# Patient Record
Sex: Female | Born: 1965
Health system: Southern US, Community
[De-identification: ages and names within clinical notes are randomized; demographics above are authoritative.]

## PROBLEM LIST (undated history)

## (undated) DIAGNOSIS — I10 Essential (primary) hypertension: Secondary | ICD-10-CM

## (undated) DIAGNOSIS — J45909 Unspecified asthma, uncomplicated: Secondary | ICD-10-CM

## (undated) DIAGNOSIS — E039 Hypothyroidism, unspecified: Secondary | ICD-10-CM

## (undated) DIAGNOSIS — E0590 Thyrotoxicosis, unspecified without thyrotoxic crisis or storm: Secondary | ICD-10-CM

## (undated) DIAGNOSIS — R112 Nausea with vomiting, unspecified: Secondary | ICD-10-CM

## (undated) DIAGNOSIS — Z9889 Other specified postprocedural states: Secondary | ICD-10-CM

## (undated) HISTORY — PX: COLONOSCOPY: SHX5424

## (undated) HISTORY — PX: ABDOMINAL HYSTERECTOMY: SHX81

---

## 1999-01-14 ENCOUNTER — Other Ambulatory Visit: Admission: RE | Admit: 1999-01-14 | Discharge: 1999-01-14 | Payer: Self-pay | Admitting: Obstetrics & Gynecology

## 2000-12-01 ENCOUNTER — Other Ambulatory Visit: Admission: RE | Admit: 2000-12-01 | Discharge: 2000-12-01 | Payer: Self-pay | Admitting: Obstetrics & Gynecology

## 2002-07-19 ENCOUNTER — Other Ambulatory Visit: Admission: RE | Admit: 2002-07-19 | Discharge: 2002-07-19 | Payer: Self-pay | Admitting: Family Medicine

## 2003-10-31 ENCOUNTER — Other Ambulatory Visit: Admission: RE | Admit: 2003-10-31 | Discharge: 2003-10-31 | Payer: Self-pay | Admitting: Family Medicine

## 2005-11-19 ENCOUNTER — Other Ambulatory Visit: Admission: RE | Admit: 2005-11-19 | Discharge: 2005-11-19 | Payer: Self-pay | Admitting: Family Medicine

## 2009-08-16 ENCOUNTER — Encounter: Admission: RE | Admit: 2009-08-16 | Discharge: 2009-08-16 | Payer: Self-pay | Admitting: Family Medicine

## 2010-02-28 ENCOUNTER — Encounter (INDEPENDENT_AMBULATORY_CARE_PROVIDER_SITE_OTHER): Payer: Self-pay | Admitting: Obstetrics & Gynecology

## 2010-02-28 ENCOUNTER — Inpatient Hospital Stay (HOSPITAL_COMMUNITY): Admission: RE | Admit: 2010-02-28 | Discharge: 2010-03-01 | Payer: Self-pay | Admitting: Obstetrics & Gynecology

## 2011-03-17 LAB — CROSSMATCH
ABO/RH(D): B POS
Antibody Screen: NEGATIVE

## 2011-03-17 LAB — CBC
MCHC: 33.1 g/dL (ref 30.0–36.0)
Platelets: 166 10*3/uL (ref 150–400)
RBC: 4.28 MIL/uL (ref 3.87–5.11)
RDW: 12.9 % (ref 11.5–15.5)
WBC: 10.9 10*3/uL — ABNORMAL HIGH (ref 4.0–10.5)

## 2011-03-17 LAB — PREGNANCY, URINE: Preg Test, Ur: NEGATIVE

## 2011-07-10 ENCOUNTER — Encounter: Payer: Self-pay | Admitting: Podiatry

## 2012-03-22 ENCOUNTER — Other Ambulatory Visit: Payer: Self-pay | Admitting: Family Medicine

## 2012-03-22 DIAGNOSIS — E049 Nontoxic goiter, unspecified: Secondary | ICD-10-CM

## 2012-03-24 ENCOUNTER — Ambulatory Visit
Admission: RE | Admit: 2012-03-24 | Discharge: 2012-03-24 | Disposition: A | Discharge status: Home / Self Care | Payer: 59 | Source: Ambulatory Visit | Attending: Family Medicine | Admitting: Family Medicine

## 2012-03-24 ENCOUNTER — Other Ambulatory Visit: Payer: Self-pay | Admitting: Family Medicine

## 2012-03-24 DIAGNOSIS — E049 Nontoxic goiter, unspecified: Secondary | ICD-10-CM

## 2012-03-24 DIAGNOSIS — E042 Nontoxic multinodular goiter: Secondary | ICD-10-CM

## 2012-03-30 ENCOUNTER — Ambulatory Visit
Admission: RE | Admit: 2012-03-30 | Discharge: 2012-03-30 | Disposition: A | Discharge status: Home / Self Care | Payer: 59 | Source: Ambulatory Visit | Attending: Family Medicine | Admitting: Family Medicine

## 2012-03-30 ENCOUNTER — Other Ambulatory Visit (HOSPITAL_COMMUNITY)
Admission: RE | Admit: 2012-03-30 | Discharge: 2012-03-30 | Disposition: A | Discharge status: Home / Self Care | Payer: 59 | Source: Ambulatory Visit | Attending: Interventional Radiology | Admitting: Interventional Radiology

## 2012-03-30 DIAGNOSIS — E042 Nontoxic multinodular goiter: Secondary | ICD-10-CM

## 2012-03-30 DIAGNOSIS — E049 Nontoxic goiter, unspecified: Secondary | ICD-10-CM | POA: Insufficient documentation

## 2012-03-31 ENCOUNTER — Inpatient Hospital Stay
Admission: RE | Admit: 2012-03-31 | Discharge: 2012-03-31 | Payer: 59 | Source: Ambulatory Visit | Attending: Family Medicine | Admitting: Family Medicine

## 2012-03-31 ENCOUNTER — Other Ambulatory Visit: Payer: 59

## 2013-03-22 ENCOUNTER — Other Ambulatory Visit: Payer: Self-pay | Admitting: Family Medicine

## 2013-03-22 DIAGNOSIS — E0590 Thyrotoxicosis, unspecified without thyrotoxic crisis or storm: Secondary | ICD-10-CM

## 2013-04-05 ENCOUNTER — Ambulatory Visit
Admission: RE | Admit: 2013-04-05 | Discharge: 2013-04-05 | Disposition: A | Discharge status: Home / Self Care | Payer: 59 | Source: Ambulatory Visit | Attending: Family Medicine | Admitting: Family Medicine

## 2013-04-05 DIAGNOSIS — E0590 Thyrotoxicosis, unspecified without thyrotoxic crisis or storm: Secondary | ICD-10-CM

## 2014-03-27 ENCOUNTER — Other Ambulatory Visit: Payer: Self-pay | Admitting: Family Medicine

## 2014-03-27 DIAGNOSIS — E041 Nontoxic single thyroid nodule: Secondary | ICD-10-CM

## 2014-03-30 ENCOUNTER — Ambulatory Visit
Admission: RE | Admit: 2014-03-30 | Discharge: 2014-03-30 | Disposition: A | Discharge status: Home / Self Care | Payer: 59 | Source: Ambulatory Visit | Attending: Family Medicine | Admitting: Family Medicine

## 2014-03-30 DIAGNOSIS — E041 Nontoxic single thyroid nodule: Secondary | ICD-10-CM

## 2015-03-30 ENCOUNTER — Other Ambulatory Visit: Payer: Self-pay | Admitting: Family Medicine

## 2015-03-30 DIAGNOSIS — E0590 Thyrotoxicosis, unspecified without thyrotoxic crisis or storm: Secondary | ICD-10-CM

## 2015-04-09 ENCOUNTER — Other Ambulatory Visit: Payer: Self-pay

## 2015-04-17 ENCOUNTER — Ambulatory Visit
Admission: RE | Admit: 2015-04-17 | Discharge: 2015-04-17 | Disposition: A | Discharge status: Home / Self Care | Payer: 59 | Source: Ambulatory Visit | Attending: Family Medicine | Admitting: Family Medicine

## 2015-04-17 DIAGNOSIS — E0590 Thyrotoxicosis, unspecified without thyrotoxic crisis or storm: Secondary | ICD-10-CM

## 2015-09-12 ENCOUNTER — Other Ambulatory Visit: Payer: Self-pay | Admitting: Otolaryngology

## 2015-09-12 DIAGNOSIS — H905 Unspecified sensorineural hearing loss: Secondary | ICD-10-CM

## 2015-09-12 DIAGNOSIS — Z77018 Contact with and (suspected) exposure to other hazardous metals: Secondary | ICD-10-CM

## 2015-09-12 DIAGNOSIS — H903 Sensorineural hearing loss, bilateral: Secondary | ICD-10-CM

## 2015-09-12 DIAGNOSIS — H9312 Tinnitus, left ear: Secondary | ICD-10-CM

## 2015-09-13 ENCOUNTER — Ambulatory Visit
Admission: RE | Admit: 2015-09-13 | Discharge: 2015-09-13 | Disposition: A | Discharge status: Home / Self Care | Payer: 59 | Source: Ambulatory Visit | Attending: Otolaryngology | Admitting: Otolaryngology

## 2015-09-13 DIAGNOSIS — Z77018 Contact with and (suspected) exposure to other hazardous metals: Secondary | ICD-10-CM

## 2015-09-13 DIAGNOSIS — H905 Unspecified sensorineural hearing loss: Secondary | ICD-10-CM

## 2015-09-13 DIAGNOSIS — H9312 Tinnitus, left ear: Secondary | ICD-10-CM

## 2015-09-13 DIAGNOSIS — H903 Sensorineural hearing loss, bilateral: Secondary | ICD-10-CM

## 2015-09-13 MED ORDER — GADOBENATE DIMEGLUMINE 529 MG/ML IV SOLN
15.0000 mL | Freq: Once | INTRAVENOUS | Status: AC | PRN
  Administered 2015-09-13: 15 mL via INTRAVENOUS

## 2016-09-09 ENCOUNTER — Other Ambulatory Visit: Payer: Self-pay | Admitting: Family Medicine

## 2016-09-09 DIAGNOSIS — E0590 Thyrotoxicosis, unspecified without thyrotoxic crisis or storm: Secondary | ICD-10-CM

## 2016-09-30 ENCOUNTER — Ambulatory Visit
Admission: RE | Admit: 2016-09-30 | Discharge: 2016-09-30 | Disposition: A | Discharge status: Home / Self Care | Payer: BLUE CROSS/BLUE SHIELD | Source: Ambulatory Visit | Attending: Family Medicine | Admitting: Family Medicine

## 2016-09-30 DIAGNOSIS — E0590 Thyrotoxicosis, unspecified without thyrotoxic crisis or storm: Secondary | ICD-10-CM

## 2017-09-22 ENCOUNTER — Other Ambulatory Visit: Payer: Self-pay | Admitting: Family Medicine

## 2017-09-22 DIAGNOSIS — E0590 Thyrotoxicosis, unspecified without thyrotoxic crisis or storm: Secondary | ICD-10-CM

## 2017-09-30 ENCOUNTER — Ambulatory Visit
Admission: RE | Admit: 2017-09-30 | Discharge: 2017-09-30 | Disposition: A | Discharge status: Home / Self Care | Payer: BLUE CROSS/BLUE SHIELD | Source: Ambulatory Visit | Attending: Family Medicine | Admitting: Family Medicine

## 2017-09-30 DIAGNOSIS — E0590 Thyrotoxicosis, unspecified without thyrotoxic crisis or storm: Secondary | ICD-10-CM

## 2018-02-26 ENCOUNTER — Other Ambulatory Visit (HOSPITAL_COMMUNITY): Payer: Self-pay | Admitting: Endocrinology

## 2018-02-26 DIAGNOSIS — E0590 Thyrotoxicosis, unspecified without thyrotoxic crisis or storm: Secondary | ICD-10-CM

## 2018-03-03 ENCOUNTER — Encounter (HOSPITAL_COMMUNITY): Payer: BLUE CROSS/BLUE SHIELD

## 2018-03-04 ENCOUNTER — Other Ambulatory Visit (HOSPITAL_COMMUNITY): Payer: BLUE CROSS/BLUE SHIELD

## 2018-03-11 ENCOUNTER — Encounter (HOSPITAL_COMMUNITY): Payer: Self-pay

## 2018-03-11 ENCOUNTER — Encounter (HOSPITAL_COMMUNITY)
Admission: RE | Admit: 2018-03-11 | Discharge: 2018-03-11 | Disposition: A | Discharge status: Home / Self Care | Payer: BLUE CROSS/BLUE SHIELD | Source: Ambulatory Visit | Attending: Endocrinology | Admitting: Endocrinology

## 2018-03-11 DIAGNOSIS — E0590 Thyrotoxicosis, unspecified without thyrotoxic crisis or storm: Secondary | ICD-10-CM | POA: Diagnosis not present

## 2018-03-11 MED ORDER — SODIUM IODIDE I 131 CAPSULE
14.6000 | Freq: Once | INTRAVENOUS | Status: AC | PRN
  Administered 2018-03-11: 14.6 via ORAL

## 2018-03-12 ENCOUNTER — Encounter (HOSPITAL_COMMUNITY)
Admission: RE | Admit: 2018-03-12 | Discharge: 2018-03-12 | Disposition: A | Discharge status: Home / Self Care | Payer: BLUE CROSS/BLUE SHIELD | Source: Ambulatory Visit | Attending: Endocrinology | Admitting: Endocrinology

## 2018-03-12 MED ORDER — SODIUM PERTECHNETATE TC 99M INJECTION
10.8000 | Freq: Once | INTRAVENOUS | Status: AC | PRN
  Administered 2018-03-12: 10.8 via INTRAVENOUS

## 2018-05-19 NOTE — H&P (Signed)
  HPI:   Kelsey Jefferson is a 52 y.o. female who presents as a consult Patient.   Referring Provider: Soyla Dryer*  Chief complaint: Hyperthyroid.  HPI: Hyperthyroidism, followed for her age. She has been subclinical the entire time. She recently went to endocrinology for the first time and options were discussed including medical treatment, radioactive iodine ablation, surgical thyroidectomy. She is interested in surgical thyroidectomy. She is here to discuss this.  PMH/Meds/All/SocHx/FamHx/ROS:   Past Medical History:  Diagnosis Date  . Hypertension  . Thyroid condition   Past Surgical History:  Procedure Laterality Date  . HYSTERECTOMY   No family history of bleeding disorders, wound healing problems or difficulty with anesthesia.   Social History   Social History  . Marital status: Married  Spouse name: N/A  . Number of children: N/A  . Years of education: N/A   Occupational History  . Not on file.   Social History Main Topics  . Smoking status: Never Smoker  . Smokeless tobacco: Current User  . Alcohol use Not on file  . Drug use: Unknown  . Sexual activity: Not on file   Other Topics Concern  . Not on file   Social History Narrative  . No narrative on file   Current Outpatient Prescriptions:  . hydroCHLOROthiazide (HYDRODIURIL) 25 MG tablet, TAKE 1 TABLET BY MOUTH EVERY DAY IN THE MORNING, Disp: , Rfl: 3  A complete ROS was performed with pertinent positives/negatives noted in the HPI. The remainder of the ROS are negative.   Physical Exam:   Ht 1.676 m ( )  Wt 76.2 kg (168 lb)  BMI 27.12 kg/m   General: Healthy and alert, in no distress, breathing easily. Normal affect. In a pleasant mood. Head: Normocephalic, atraumatic. No masses, or scars. Eyes: Pupils are equal, and reactive to light. Vision is grossly intact. No spontaneous or gaze nystagmus. Ears: Bilateral cerumen impaction cleaned out under the microscope. Tympanic membranes  are intact, with normal landmarks and the middle ears are clear and healthy. Hearing: Grossly normal. Nose: Nasal cavities are clear with healthy mucosa, no polyps or exudate. Airways are patent. Face: No masses or scars, facial nerve function is symmetric. Oral Cavity: No mucosal abnormalities are noted. Tongue with normal mobility. Dentition appears healthy. Oropharynx: Tonsils are symmetric. There are no mucosal masses identified. Tongue base appears normal and healthy. Larynx/Hypopharynx: indirect exam reveals healthy, mobile vocal cords, without mucosal lesions in the hypopharynx or larynx. Chest: Deferred Neck: No palpable masses, no cervical adenopathy, nondescript enlargement of the left lobe of the thyroid. Neuro: Cranial nerves II-XII with normal function. Balance: Normal gate. Other findings: none.  Independent Review of Additional Tests or Records:  none  Procedures:  Procedure note:  Indications: Cerumen impaction  Details of cerumen removal were discussed with the patient and all questions were answered.  Procedure:  Using the operating microscope, both sides were cleaned of cerumen using curettes. There was no signs of infection. Symptoms were releaved.  She tolerated this procedure well. There were no complications.  Impression & Plans:  Bilateral cerumen impaction, cleaned out under the microscope today. Ears otherwise healthy.  Subclinical hyperthyroidism. We discussed the risks and benefits of thyroidectomy surgery. All questions were answered and a handout was provided. She would like to think about this and will call when she is ready to schedule.

## 2018-06-10 NOTE — Pre-Procedure Instructions (Signed)
Ricarda FrameYvonne D Riebel  06/10/2018      [id number] [place] - [id number] [place] [id number] Phone: [phone] Fax: [phone]    Your procedure is scheduled on June 28  [facility]30 A.M.  Call this number if you have problems the morning of surgery:  [phone]   Remember:  NOTHING TO EAT OR DRINK AFTER MIDNIGHT    Take these medicines the morning of surgery with A SIP OF WATER  loratadine (CLARITIN)  7 days prior to surgery STOP taking any Aspirin(unless otherwise instructed by your surgeon), Aleve, Naproxen, Ibuprofen, Motrin, Advil, Goody's, BC's, all herbal medications, fish oil, and all vitamins     Do not wear jewelry, make-up or nail polish.  Do not wear lotions, powders, or perfumes, or deodorant.  Do not shave 48 hours prior to surgery.   Do not bring valuables to the hospital.  [facility] is not responsible for any belongings or valuables.  Contacts, dentures or bridgework may not be worn into surgery.  Leave your suitcase in the car.  After surgery it may be brought to your room.  For patients admitted to the hospital, discharge time will be determined by your treatment team.  Patients discharged the day of surgery will not be allowed to drive home.    Special instructions:   [facility]- Preparing For Surgery  Before surgery, you can play an important role. Because skin is not sterile, your skin needs to be as free of germs as possible. You can reduce the number of germs on your skin by washing with CHG (chlorahexidine gluconate) Soap before surgery.  CHG is an antiseptic cleaner which kills germs and bonds with the skin to continue killing germs even after washing.    Oral Hygiene is also important to reduce your risk of infection.  Remember - BRUSH YOUR TEETH THE MORNING OF SURGERY WITH YOUR REGULAR TOOTHPASTE  Please do not use  if you have an allergy to CHG or antibacterial soaps. If your skin becomes reddened/irritated stop using the CHG.  Do not shave (including legs and underarms) for at least 48 hours prior to first CHG shower. It is OK to shave your face.  Please follow these instructions carefully.   1. Shower the NIGHT BEFORE SURGERY and the MORNING OF SURGERY with CHG.   2. If you chose to wash your hair, wash your hair first as usual with your normal shampoo.  3. After you shampoo, rinse your hair and body thoroughly to remove the shampoo.  4. Use CHG as you would any other liquid soap. You can apply CHG directly to the skin and wash gently with a scrungie or a clean washcloth.   5. Apply the CHG Soap to your body ONLY FROM THE NECK DOWN.  Do not use on open wounds or open sores. Avoid contact with your eyes, ears, mouth and genitals (private parts). Wash Face and genitals (private parts)  with your normal soap.  6. Wash thoroughly, paying special attention to the area where your surgery will be performed.  7. Thoroughly rinse your body with warm water from the neck down.  8. DO NOT shower/wash with your normal soap after using and rinsing off the CHG Soap.  9. Pat yourself dry with a CLEAN TOWEL.  10. Wear CLEAN PAJAMAS to bed the night before surgery, wear comfortable clothes the morning  of surgery  11. Place CLEAN SHEETS on your bed the night of your first shower and DO NOT SLEEP WITH PETS.    Day of Surgery:  Do not apply any deodorants/lotions.  Please wear clean clothes to the hospital/surgery center.   Remember to brush your teeth WITH YOUR REGULAR TOOTHPASTE.   Please read over the following fact sheets that you were given.

## 2018-06-11 ENCOUNTER — Encounter (HOSPITAL_COMMUNITY)
Admission: RE | Admit: 2018-06-11 | Discharge: 2018-06-11 | Disposition: A | Discharge status: Home / Self Care | Payer: BLUE CROSS/BLUE SHIELD | Source: Ambulatory Visit | Attending: Otolaryngology | Admitting: Otolaryngology

## 2018-06-11 ENCOUNTER — Other Ambulatory Visit: Payer: Self-pay

## 2018-06-11 ENCOUNTER — Ambulatory Visit (HOSPITAL_COMMUNITY)
Admission: RE | Admit: 2018-06-11 | Discharge: 2018-06-11 | Disposition: A | Discharge status: Home / Self Care | Payer: BLUE CROSS/BLUE SHIELD | Source: Ambulatory Visit | Attending: Anesthesiology | Admitting: Anesthesiology

## 2018-06-11 ENCOUNTER — Other Ambulatory Visit (HOSPITAL_COMMUNITY): Payer: BLUE CROSS/BLUE SHIELD

## 2018-06-11 ENCOUNTER — Encounter (HOSPITAL_COMMUNITY): Payer: Self-pay

## 2018-06-11 DIAGNOSIS — E0590 Thyrotoxicosis, unspecified without thyrotoxic crisis or storm: Secondary | ICD-10-CM | POA: Insufficient documentation

## 2018-06-11 DIAGNOSIS — Z01818 Encounter for other preprocedural examination: Secondary | ICD-10-CM

## 2018-06-11 DIAGNOSIS — I10 Essential (primary) hypertension: Secondary | ICD-10-CM | POA: Diagnosis not present

## 2018-06-11 DIAGNOSIS — I498 Other specified cardiac arrhythmias: Secondary | ICD-10-CM | POA: Diagnosis not present

## 2018-06-11 HISTORY — DX: Nausea with vomiting, unspecified: Z98.890

## 2018-06-11 HISTORY — DX: Essential (primary) hypertension: I10

## 2018-06-11 HISTORY — DX: Nausea with vomiting, unspecified: R11.2

## 2018-06-11 LAB — CBC
HCT: 42.5 % (ref 36.0–46.0)
Hemoglobin: 14.2 g/dL (ref 12.0–15.0)
MCH: 31.1 pg (ref 26.0–34.0)
MCHC: 33.4 g/dL (ref 30.0–36.0)
MCV: 93.2 fL (ref 78.0–100.0)
PLATELETS: 190 10*3/uL (ref 150–400)
RBC: 4.56 MIL/uL (ref 3.87–5.11)
RDW: 12 % (ref 11.5–15.5)
WBC: 5.3 10*3/uL (ref 4.0–10.5)

## 2018-06-11 LAB — BASIC METABOLIC PANEL
Anion gap: 8 (ref 5–15)
BUN: 13 mg/dL (ref 6–20)
CALCIUM: 9.2 mg/dL (ref 8.9–10.3)
CO2: 26 mmol/L (ref 22–32)
Chloride: 106 mmol/L (ref 101–111)
Creatinine, Ser: 0.91 mg/dL (ref 0.44–1.00)
GFR calc Af Amer: >60 mL/min (ref >60)
Glucose, Bld: 93 mg/dL (ref 65–99)
POTASSIUM: 3.9 mmol/L (ref 3.5–5.1)
SODIUM: 140 mmol/L (ref 135–145)

## 2018-06-11 NOTE — Progress Notes (Signed)
PCP - Sigmund HazelLisa Miller  Cardiologist - denies  Chest x-ray - 06/11/18 EKG - 06/11/18 Stress Test - denies ECHO - denies Cardiac Cath - denies   Anesthesia review: NO  Patient denies shortness of breath, fever, cough and chest pain at PAT appointment   Patient verbalized understanding of instructions that were given to them at the PAT appointment. Patient was also instructed that they will need to review over the PAT instructions again at home before surgery.

## 2018-06-18 ENCOUNTER — Other Ambulatory Visit: Payer: Self-pay

## 2018-06-18 ENCOUNTER — Ambulatory Visit (HOSPITAL_COMMUNITY): Payer: BLUE CROSS/BLUE SHIELD | Admitting: Anesthesiology

## 2018-06-18 ENCOUNTER — Encounter (HOSPITAL_COMMUNITY)
Admission: RE | Disposition: A | Discharge status: Home / Self Care | Payer: Self-pay | Source: Ambulatory Visit | Attending: Otolaryngology

## 2018-06-18 ENCOUNTER — Observation Stay (HOSPITAL_COMMUNITY)
Admission: RE | Admit: 2018-06-18 | Discharge: 2018-06-19 | Disposition: A | Discharge status: Home / Self Care | Payer: BLUE CROSS/BLUE SHIELD | Source: Ambulatory Visit | Attending: Otolaryngology | Admitting: Otolaryngology

## 2018-06-18 ENCOUNTER — Encounter (HOSPITAL_COMMUNITY): Payer: Self-pay | Admitting: *Deleted

## 2018-06-18 DIAGNOSIS — E890 Postprocedural hypothyroidism: Secondary | ICD-10-CM

## 2018-06-18 DIAGNOSIS — Z79899 Other long term (current) drug therapy: Secondary | ICD-10-CM | POA: Insufficient documentation

## 2018-06-18 DIAGNOSIS — Z91040 Latex allergy status: Secondary | ICD-10-CM | POA: Diagnosis not present

## 2018-06-18 DIAGNOSIS — Z90710 Acquired absence of both cervix and uterus: Secondary | ICD-10-CM | POA: Diagnosis not present

## 2018-06-18 DIAGNOSIS — E041 Nontoxic single thyroid nodule: Secondary | ICD-10-CM | POA: Insufficient documentation

## 2018-06-18 DIAGNOSIS — Z91048 Other nonmedicinal substance allergy status: Secondary | ICD-10-CM | POA: Diagnosis not present

## 2018-06-18 DIAGNOSIS — I10 Essential (primary) hypertension: Secondary | ICD-10-CM | POA: Diagnosis not present

## 2018-06-18 DIAGNOSIS — Z91013 Allergy to seafood: Secondary | ICD-10-CM | POA: Insufficient documentation

## 2018-06-18 DIAGNOSIS — E0590 Thyrotoxicosis, unspecified without thyrotoxic crisis or storm: Secondary | ICD-10-CM | POA: Diagnosis not present

## 2018-06-18 HISTORY — PX: TOTAL THYROIDECTOMY: SHX2547

## 2018-06-18 HISTORY — PX: THYROIDECTOMY: SHX17

## 2018-06-18 HISTORY — DX: Unspecified asthma, uncomplicated: J45.909

## 2018-06-18 HISTORY — DX: Thyrotoxicosis, unspecified without thyrotoxic crisis or storm: E05.90

## 2018-06-18 HISTORY — DX: Hypothyroidism, unspecified: E03.9

## 2018-06-18 LAB — CALCIUM
CALCIUM: 8.8 mg/dL — AB (ref 8.9–10.3)
Calcium: 8.9 mg/dL (ref 8.9–10.3)

## 2018-06-18 SURGERY — THYROIDECTOMY
Anesthesia: General | Site: Neck

## 2018-06-18 MED ORDER — FENTANYL CITRATE (PF) 100 MCG/2ML IJ SOLN
INTRAMUSCULAR | Status: DC | PRN
  Administered 2018-06-18: 150 ug via INTRAVENOUS
  Administered 2018-06-18: 50 ug via INTRAVENOUS

## 2018-06-18 MED ORDER — DEXAMETHASONE SODIUM PHOSPHATE 10 MG/ML IJ SOLN
INTRAMUSCULAR | Status: AC
  Filled 2018-06-18: qty 1

## 2018-06-18 MED ORDER — ONDANSETRON HCL 4 MG/2ML IJ SOLN
INTRAMUSCULAR | Status: DC | PRN
  Administered 2018-06-18: 4 mg via INTRAVENOUS

## 2018-06-18 MED ORDER — LIDOCAINE HCL (CARDIAC) PF 100 MG/5ML IV SOSY
PREFILLED_SYRINGE | INTRAVENOUS | Status: DC | PRN
  Administered 2018-06-18: 80 mg via INTRAVENOUS

## 2018-06-18 MED ORDER — PROMETHAZINE HCL 25 MG RE SUPP: 25.0000 mg | Freq: Four times a day (QID) | RECTAL | 1 refills | Status: AC | PRN

## 2018-06-18 MED ORDER — FENTANYL CITRATE (PF) 250 MCG/5ML IJ SOLN
INTRAMUSCULAR | Status: AC
  Filled 2018-06-18: qty 5

## 2018-06-18 MED ORDER — PROMETHAZINE HCL 25 MG RE SUPP
25.0000 mg | Freq: Four times a day (QID) | RECTAL | Status: DC | PRN
  Filled 2018-06-18: qty 1

## 2018-06-18 MED ORDER — LEVOTHYROXINE SODIUM 100 MCG PO TABS: 100.0000 ug | ORAL_TABLET | Freq: Every day | ORAL | 6 refills | Status: AC

## 2018-06-18 MED ORDER — MIDAZOLAM HCL 2 MG/2ML IJ SOLN
INTRAMUSCULAR | Status: AC
  Filled 2018-06-18: qty 2

## 2018-06-18 MED ORDER — HYDROCHLOROTHIAZIDE 25 MG PO TABS
25.0000 mg | ORAL_TABLET | Freq: Every evening | ORAL | Status: DC
  Administered 2018-06-18: 25 mg via ORAL
  Filled 2018-06-18: qty 1

## 2018-06-18 MED ORDER — PROMETHAZINE HCL 25 MG PO TABS: 25.0000 mg | ORAL_TABLET | Freq: Four times a day (QID) | ORAL | Status: DC | PRN

## 2018-06-18 MED ORDER — ROCURONIUM BROMIDE 100 MG/10ML IV SOLN
INTRAVENOUS | Status: DC | PRN
  Administered 2018-06-18: 50 mg via INTRAVENOUS

## 2018-06-18 MED ORDER — LEVOTHYROXINE SODIUM 100 MCG PO TABS
100.0000 ug | ORAL_TABLET | Freq: Every day | ORAL | Status: DC
  Administered 2018-06-19: 100 ug via ORAL
  Filled 2018-06-18: qty 1

## 2018-06-18 MED ORDER — LORATADINE 10 MG PO TABS: 10.0000 mg | ORAL_TABLET | Freq: Every day | ORAL | Status: DC | PRN

## 2018-06-18 MED ORDER — MEGA MULTIVITAMIN PO POWD: 1.0000 | ORAL | Status: DC

## 2018-06-18 MED ORDER — 0.9 % SODIUM CHLORIDE (POUR BTL) OPTIME
TOPICAL | Status: DC | PRN
  Administered 2018-06-18: 1000 mL

## 2018-06-18 MED ORDER — PROPOFOL 10 MG/ML IV BOLUS
INTRAVENOUS | Status: AC
  Filled 2018-06-18: qty 20

## 2018-06-18 MED ORDER — SUGAMMADEX SODIUM 200 MG/2ML IV SOLN
INTRAVENOUS | Status: DC | PRN
  Administered 2018-06-18: 75 mg via INTRAVENOUS
  Administered 2018-06-18: 100 mg via INTRAVENOUS

## 2018-06-18 MED ORDER — ONDANSETRON HCL 4 MG/2ML IJ SOLN
INTRAMUSCULAR | Status: AC
  Filled 2018-06-18: qty 2

## 2018-06-18 MED ORDER — ADULT MULTIVITAMIN W/MINERALS CH: 1.0000 | ORAL_TABLET | ORAL | Status: DC

## 2018-06-18 MED ORDER — SUGAMMADEX SODIUM 200 MG/2ML IV SOLN
INTRAVENOUS | Status: AC
  Filled 2018-06-18: qty 2

## 2018-06-18 MED ORDER — HYDROCODONE-ACETAMINOPHEN 5-325 MG PO TABS: 1.0000 | ORAL_TABLET | ORAL | Status: DC | PRN

## 2018-06-18 MED ORDER — LIDOCAINE 2% (20 MG/ML) 5 ML SYRINGE
INTRAMUSCULAR | Status: AC
  Filled 2018-06-18: qty 5

## 2018-06-18 MED ORDER — MIDAZOLAM HCL 5 MG/5ML IJ SOLN
INTRAMUSCULAR | Status: DC | PRN
  Administered 2018-06-18: 2 mg via INTRAVENOUS

## 2018-06-18 MED ORDER — DEXTROSE-NACL 5-0.9 % IV SOLN
INTRAVENOUS | Status: DC
  Administered 2018-06-18: 1 mL via INTRAVENOUS

## 2018-06-18 MED ORDER — LIDOCAINE-EPINEPHRINE 1 %-1:100000 IJ SOLN
INTRAMUSCULAR | Status: DC | PRN
  Administered 2018-06-18: 20 mL

## 2018-06-18 MED ORDER — LACTATED RINGERS IV SOLN
INTRAVENOUS | Status: DC
  Administered 2018-06-18 (×2): via INTRAVENOUS

## 2018-06-18 MED ORDER — DEXAMETHASONE SODIUM PHOSPHATE 10 MG/ML IJ SOLN
INTRAMUSCULAR | Status: DC | PRN
  Administered 2018-06-18: 10 mg via INTRAVENOUS

## 2018-06-18 MED ORDER — IBUPROFEN 100 MG/5ML PO SUSP
400.0000 mg | Freq: Four times a day (QID) | ORAL | Status: DC | PRN
  Filled 2018-06-18: qty 20

## 2018-06-18 MED ORDER — VALACYCLOVIR HCL 500 MG PO TABS: 500.0000 mg | ORAL_TABLET | Freq: Two times a day (BID) | ORAL | Status: DC | PRN

## 2018-06-18 MED ORDER — PROPOFOL 10 MG/ML IV BOLUS
INTRAVENOUS | Status: DC | PRN
  Administered 2018-06-18: 140 mg via INTRAVENOUS

## 2018-06-18 MED ORDER — MULTIVITAMIN PO LIQD: 1.0000 | ORAL | Status: DC

## 2018-06-18 MED ORDER — LIDOCAINE-EPINEPHRINE 1 %-1:100000 IJ SOLN
INTRAMUSCULAR | Status: AC
  Filled 2018-06-18: qty 1

## 2018-06-18 MED ORDER — PHENYLEPHRINE HCL 10 MG/ML IJ SOLN
INTRAMUSCULAR | Status: DC | PRN
  Administered 2018-06-18 (×3): 80 ug via INTRAVENOUS

## 2018-06-18 MED ORDER — HYDROCODONE-ACETAMINOPHEN 7.5-325 MG PO TABS: 1.0000 | ORAL_TABLET | Freq: Four times a day (QID) | ORAL | 0 refills | Status: AC | PRN

## 2018-06-18 SURGICAL SUPPLY — 46 items
ADH SKN CLS APL DERMABOND .7 (GAUZE/BANDAGES/DRESSINGS) ×1
BLADE SURG 15 STRL LF DISP TIS (BLADE) IMPLANT
BLADE SURG 15 STRL SS (BLADE)
CANISTER SUCT 3000ML PPV (MISCELLANEOUS) ×2 IMPLANT
CLEANER TIP ELECTROSURG 2X2 (MISCELLANEOUS) ×2 IMPLANT
CONT SPEC 4OZ CLIKSEAL STRL BL (MISCELLANEOUS) ×2 IMPLANT
CORDS BIPOLAR (ELECTRODE) ×2 IMPLANT
COVER SURGICAL LIGHT HANDLE (MISCELLANEOUS) ×2 IMPLANT
DERMABOND ADVANCED (GAUZE/BANDAGES/DRESSINGS) ×1
DERMABOND ADVANCED .7 DNX12 (GAUZE/BANDAGES/DRESSINGS) ×1 IMPLANT
DRAIN HEMOVAC 7FR (DRAIN) IMPLANT
DRAIN SNY 10 ROU (WOUND CARE) ×1 IMPLANT
DRAPE HALF SHEET 40X57 (DRAPES) IMPLANT
ELECT COATED BLADE 2.86 ST (ELECTRODE) ×2 IMPLANT
ELECT REM PT RETURN 9FT ADLT (ELECTROSURGICAL) ×2
ELECTRODE REM PT RTRN 9FT ADLT (ELECTROSURGICAL) ×1 IMPLANT
EVACUATOR SILICONE 100CC (DRAIN) ×2 IMPLANT
FORCEPS BIPOLAR SPETZLER 8 1.0 (NEUROSURGERY SUPPLIES) ×2 IMPLANT
GAUZE SPONGE 4X4 16PLY XRAY LF (GAUZE/BANDAGES/DRESSINGS) IMPLANT
GLOVE BIO SURGEON STRL SZ 6.5 (GLOVE) IMPLANT
GLOVE BIOGEL PI IND STRL 7.5 (GLOVE) IMPLANT
GLOVE BIOGEL PI INDICATOR 7.5 (GLOVE) ×1
GLOVE ECLIPSE 7.5 STRL STRAW (GLOVE) ×1 IMPLANT
GLOVE SURG SS PI 6.5 STRL IVOR (GLOVE) ×1 IMPLANT
GLOVE SURG SS PI 7.5 STRL IVOR (GLOVE) ×1 IMPLANT
GLOVE SURG SYN 7.5  E (GLOVE) ×1
GLOVE SURG SYN 7.5 E (GLOVE) ×1 IMPLANT
GLOVE SURG SYN 7.5 PF PI (GLOVE) IMPLANT
GOWN STRL REUS W/ TWL LRG LVL3 (GOWN DISPOSABLE) ×2 IMPLANT
GOWN STRL REUS W/TWL LRG LVL3 (GOWN DISPOSABLE) ×6
KIT BASIN OR (CUSTOM PROCEDURE TRAY) ×2 IMPLANT
KIT TURNOVER KIT B (KITS) ×2 IMPLANT
NDL PRECISIONGLIDE 27X1.5 (NEEDLE) ×1 IMPLANT
NEEDLE PRECISIONGLIDE 27X1.5 (NEEDLE) ×2 IMPLANT
NS IRRIG 1000ML POUR BTL (IV SOLUTION) ×2 IMPLANT
PAD ARMBOARD 7.5X6 YLW CONV (MISCELLANEOUS) ×4 IMPLANT
PENCIL FOOT CONTROL (ELECTRODE) ×2 IMPLANT
SHEARS HARMONIC 9CM CVD (BLADE) ×2 IMPLANT
STAPLER VISISTAT 35W (STAPLE) ×2 IMPLANT
SUT CHROMIC 3 0 PS 2 (SUTURE) ×2 IMPLANT
SUT CHROMIC 4 0 PS 2 18 (SUTURE) IMPLANT
SUT ETHILON 3 0 PS 1 (SUTURE) ×3 IMPLANT
SUT SILK 3 0 REEL (SUTURE) IMPLANT
SUT SILK 4 0 REEL (SUTURE) ×2 IMPLANT
TOWEL OR 17X24 6PK STRL BLUE (TOWEL DISPOSABLE) ×2 IMPLANT
TRAY ENT MC OR (CUSTOM PROCEDURE TRAY) ×2 IMPLANT

## 2018-06-18 NOTE — Progress Notes (Signed)
   Subjective:    Patient ID: Kelsey FrameYvonne D Jefferson, female    DOB: 02/27/1966, 52 y.o.   MRN: [id number]  HPI She is doing well.  No complaints.  Review of Systems     Objective:   Physical Exam AF VSS Alert, NAD Drain functioning. Incision clean and intact. Voice normal.   Preop calcium 9.2.  Postop 8.8  Assessment & Plan:  S/p total thyroidectomy  She looks great.  Drain is functioning.  Will monitor calcium.  Likely discharge tomorrow.

## 2018-06-18 NOTE — Op Note (Signed)
OPERATIVE REPORT  DATE OF SURGERY: 06/18/2018  PATIENT:  Kelsey FrameYvonne D Kaltenbach,  52 y.o. female  PRE-OPERATIVE DIAGNOSIS:  Hyperthyoidism  POST-OPERATIVE DIAGNOSIS:  Hyperthyoidism  PROCEDURE:  Procedure(s): THYROIDECTOMY  SURGEON:  Susy FrizzleJefry H Lindie Roberson, MD  ASSISTANTS: none  ANESTHESIA:   General   EBL: 20 ml  DRAINS: 10 French round JP  LOCAL MEDICATIONS USED: 1% Xylocaine with epinephrine  SPECIMEN: Total thyroidectomy, suture marks left lobe  COUNTS:  Correct  PROCEDURE DETAILS: The patient was taken to the operating room and placed on the operating table in the supine position. A shoulder roll was placed beneath the shoulder blades and the neck was extended. The neck was prepped and draped in a standard fashion. A low collar transverse incision was outlined with a marking pen and was incised with a #15 scalpel following local anesthetic infiltration. Dissection was continued down through the platysma layer.  Self-retaining retractors were used throughout the case.  The midline fascia was divided.  The strap muscles were reflected off of the thyroid lobes, starting on the left.  The left lobe was slightly large with a couple of palpable nodules.  The right side did not reveal any pathology.  The superior vasculature was identified initially, ligated between clamps and divided using the harmonic dissector.  The middle thyroid vein was treated in a similar fashion.  A superior parathyroid was identified and preserved with its blood supply.  The recurrent nerve was identified and preserved as well.  The inferior vasculature was dissected in a similar fashion and the gland was brought forward off the trachea.  Berry ligament was divided using electrocautery.  The left lobe was marked with a suture.  The right lobe was dissected in a similar fashion.  Another superior parathyroid was identified and preserved with its blood supply.  The recurrent nerve was also identified in the deep fascia and  preserved.  The entire thyroid was delivered and sent for pathologic evaluation.  Hemostasis was completed using bipolar cautery as needed.  The wound was irrigated with saline.  The drain was secured in place with a nylon suture. The midline fascia was reapproximated with interrupted chromic suture. The platysma layer was also reapproximated with interrupted chromic suture. A running subcuticular closure was accomplished. Dermabond was used on the skin. The drain was charged. The patient was awakened, extubated and transferred to recovery in stable condition.   PATIENT DISPOSITION:  To PACU, stable

## 2018-06-18 NOTE — Transfer of Care (Signed)
Immediate Anesthesia Transfer of Care Note  Patient: Kelsey FrameYvonne D Jefferson  Procedure(s) Performed: THYROIDECTOMY (N/A Neck)  Patient Location: PACU  Anesthesia Type:General  Level of Consciousness: oriented, drowsy and patient cooperative  Airway & Oxygen Therapy: Patient Spontanous Breathing and Patient connected to nasal cannula oxygen  Post-op Assessment: Report given to RN and Post -op Vital signs reviewed and stable  Post vital signs: Reviewed  Last Vitals:  Vitals Value Taken Time  BP 117/70 06/18/2018 11:37 AM  Temp    Pulse 73 06/18/2018 11:37 AM  Resp 15 06/18/2018 11:37 AM  SpO2 100 % 06/18/2018 11:37 AM  Vitals shown include unvalidated device data.  Last Pain:  Vitals:   06/18/18 0748  TempSrc:   PainSc: 0-No pain         Complications: No apparent anesthesia complications

## 2018-06-18 NOTE — Discharge Instructions (Signed)
You may shower and use soap and water. Do not use any creams, oils or ointment. ° °

## 2018-06-18 NOTE — Progress Notes (Signed)
Pt new admit from PACU s/p total thydroidectomy with mid neck wound open to air with skin glued and left JP drain, alert and oriented, no complain of pain at this time, family at the bedside

## 2018-06-18 NOTE — Anesthesia Preprocedure Evaluation (Addendum)
Anesthesia Evaluation  Patient identified by MRN, date of birth, ID band Patient awake    Reviewed: Allergy & Precautions, NPO status , Patient's Chart, lab work & pertinent test results  History of Anesthesia Complications (+) PONV and history of anesthetic complications  Airway Mallampati: II  TM Distance: >3 FB Neck ROM: Full    Dental  (+) Dental Advisory Given, Teeth Intact   Pulmonary neg pulmonary ROS,    Pulmonary exam normal        Cardiovascular hypertension, Pt. on medications  Rhythm:Regular Rate:Normal     Neuro/Psych negative neurological ROS     GI/Hepatic negative GI ROS, Neg liver ROS,   Endo/Other  negative endocrine ROS  Renal/GU negative Renal ROS     Musculoskeletal negative musculoskeletal ROS (+)   Abdominal Normal abdominal exam  (+)   Peds  Hematology negative hematology ROS (+)   Anesthesia Other Findings   Reproductive/Obstetrics                           Lab Results  Component Value Date   WBC 5.3 06/11/2018   HGB 14.2 06/11/2018   HCT 42.5 06/11/2018   MCV 93.2 06/11/2018   PLT 190 06/11/2018   Lab Results  Component Value Date   CREATININE 0.91 06/11/2018   BUN 13 06/11/2018   NA 140 06/11/2018   K 3.9 06/11/2018   CL 106 06/11/2018   CO2 26 06/11/2018   No results found for: INR, PROTIME  EKG: normal sinus rhythm.  Anesthesia Physical Anesthesia Plan  ASA: II  Anesthesia Plan: General   Post-op Pain Management:    Induction: Intravenous  PONV Risk Score and Plan: 4 or greater and Ondansetron, Dexamethasone, Midazolam and Scopolamine patch - Pre-op  Airway Management Planned: Oral ETT  Additional Equipment: None  Intra-op Plan:   Post-operative Plan: Extubation in OR  Informed Consent: I have reviewed the patients History and Physical, chart, labs and discussed the procedure including the risks, benefits and alternatives for  the proposed anesthesia with the patient or authorized representative who has indicated his/her understanding and acceptance.   Dental advisory given  Plan Discussed with: CRNA  Anesthesia Plan Comments:        Anesthesia Quick Evaluation

## 2018-06-18 NOTE — Anesthesia Procedure Notes (Signed)
Procedure Name: Intubation Date/Time: 06/18/2018 10:16 AM Performed by: Lovie Cholock, Seline Enzor K, CRNA Pre-anesthesia Checklist: Patient identified, Emergency Drugs available, Suction available and Patient being monitored Patient Re-evaluated:Patient Re-evaluated prior to induction Oxygen Delivery Method: Circle System Utilized Preoxygenation: Pre-oxygenation with 100% oxygen Induction Type: IV induction Ventilation: Mask ventilation without difficulty Laryngoscope Size: Miller and 2 Grade View: Grade I Tube type: Oral Tube size: 7.0 mm Number of attempts: 1 Airway Equipment and Method: Stylet and Oral airway Placement Confirmation: ETT inserted through vocal cords under direct vision,  positive ETCO2 and breath sounds checked- equal and bilateral Secured at: 21 cm Tube secured with: Tape Dental Injury: Teeth and Oropharynx as per pre-operative assessment

## 2018-06-18 NOTE — Interval H&P Note (Signed)
History and Physical Interval Note:  06/18/2018 9:42 AM  Kelsey Jefferson  has presented today for surgery, with the diagnosis of Hyperthyoidism  The various methods of treatment have been discussed with the patient and family. After consideration of risks, benefits and other options for treatment, the patient has consented to  Procedure(s): THYROIDECTOMY (N/A) as a surgical intervention .  The patient's history has been reviewed, patient examined, no change in status, stable for surgery.  I have reviewed the patient's chart and labs.  Questions were answered to the patient's satisfaction.     Serena ColonelJefry Deshanta Lady

## 2018-06-18 NOTE — Anesthesia Postprocedure Evaluation (Signed)
Anesthesia Post Note  Patient: Ricarda FrameYvonne D Yamin  Procedure(s) Performed: THYROIDECTOMY (N/A Neck)     Patient location during evaluation: PACU Anesthesia Type: General Level of consciousness: awake and alert and oriented Pain management: pain level controlled Vital Signs Assessment: post-procedure vital signs reviewed and stable Respiratory status: spontaneous breathing, nonlabored ventilation, respiratory function stable and patient connected to nasal cannula oxygen Cardiovascular status: blood pressure returned to baseline and stable Postop Assessment: no apparent nausea or vomiting Anesthetic complications: no    Last Vitals:  Vitals:   06/18/18 1205 06/18/18 1222  BP: 128/76 134/74  Pulse: 65 66  Resp: 15 16  Temp: (!) 36.2 C 36.7 C  SpO2: 98% 100%    Last Pain:  Vitals:   06/18/18 1222  TempSrc: Oral  PainSc:                  Theola Cuellar A.

## 2018-06-19 ENCOUNTER — Encounter (HOSPITAL_COMMUNITY): Payer: Self-pay | Admitting: Otolaryngology

## 2018-06-19 DIAGNOSIS — E0590 Thyrotoxicosis, unspecified without thyrotoxic crisis or storm: Secondary | ICD-10-CM | POA: Diagnosis not present

## 2018-06-19 LAB — CALCIUM: Calcium: 8.3 mg/dL — ABNORMAL LOW (ref 8.9–10.3)

## 2018-06-19 NOTE — Progress Notes (Signed)
Patient was given discharge instructions. Patient verbalized understanding. JP drain site clean, dry and intact. Patient provided living will, a copy was placed on the chart. Patient left unit in stable condition.

## 2018-06-19 NOTE — Discharge Summary (Signed)
Physician Discharge Summary  Patient ID: Kelsey FrameYvonne D Goonan MRN: [id number] DOB/AGE: 51/01/1966 51 y.o.  Admit date: 06/18/2018 Discharge date: 06/19/2018  Admission Diagnoses: Hyperthyroidism  Discharge Diagnoses: same Active Problems:   S/P total thyroidectomy   Discharged Condition: good  Hospital Course: 52 year old female presented for total thyroidectomy.  See operative note.  She was observed overnight with a drain in place and did quite well with no complaints.  The drain was removed on POD 2 and she was felt stable for discharge.  Consults: None  Significant Diagnostic Studies: None  Treatments: surgery: Total thyroidectomy  Discharge Exam: Blood pressure 119/69, pulse 100, temperature 98.6 F (37 C), temperature source Oral, resp. rate 18, height 5\' 6"  (1.676 m), weight 176 lb 12.9 oz (80.2 kg), SpO2 100 %. General appearance: alert, cooperative and no distress Neck: thyroid incision clean and intact, no fluid collection, drain removed, normal voice  Disposition: Discharge disposition: 01-Home or Self Care       Discharge Instructions    Diet - low sodium heart healthy   Complete by:  As directed    Discharge instructions   Complete by:  As directed    OK to allow incision to get wet, gently pat dry.  Do not apply ointment to incision.  Change dry dressing over drain site until drainage stops.  Avoid strenuous activity.  Call with significant swelling, fever, or purulent drainage.  Call with muscle spasms or muscle twitching.   Increase activity slowly   Complete by:  As directed      Allergies as of 06/19/2018      Reactions   Fish Allergy Hives, Itching, Nausea And Vomiting   Adhesive [tape] Itching   Latex Itching      Medication List    STOP taking these medications   MEGA MULTIVITAMIN Powd   MULTIVITAMIN Liqd     TAKE these medications   BC HEADACHE POWDER PO Take 1 packet by mouth daily as needed (pain).   BLACK CURRANT SEED OIL PO Take 1  Dose by mouth daily.   CALCIUM-MAGNESIUM-VITAMIN D PO Take 1 Dose by mouth daily.   hydrochlorothiazide 25 MG tablet Commonly known as:  HYDRODIURIL Take 25 mg by mouth every evening.   HYDROcodone-acetaminophen 7.5-325 MG tablet Commonly known as:  NORCO Take 1 tablet by mouth every 6 (six) hours as needed for moderate pain.   hydrocortisone 2.5 % cream Apply 1 application topically 2 (two) times daily as needed (eczema).   levothyroxine 100 MCG tablet Commonly known as:  SYNTHROID Take 1 tablet (100 mcg total) by mouth daily.   loratadine 10 MG tablet Commonly known as:  CLARITIN Take 10 mg by mouth daily as needed for allergies.   Olopatadine HCl 0.2 % Soln Place 1 drop into both eyes daily as needed for irritation.   OVER THE COUNTER MEDICATION Take 1 tablet by mouth 3 (three) times daily with meals. Glucose support otc supplement   promethazine 25 MG suppository Commonly known as:  PHENERGAN Place 1 suppository (25 mg total) rectally every 6 (six) hours as needed for nausea or vomiting.   valACYclovir 500 MG tablet Commonly known as:  VALTREX Take 500 mg by mouth 2 (two) times daily as needed (outbreak).   VITAMIN C PO Take 1 tablet by mouth daily as needed (immune support).      Follow-up Information    Serena Colonelosen, Jefry, MD. Schedule an appointment as soon as possible for a visit in 1 week.  Specialty:  Otolaryngology Contact information: [address] [place] [address] [phone]           Signed: Christia Reading 06/19/2018, 8:40 AM

## 2019-02-06 IMAGING — NM NM THYROID IMAGING W/ UPTAKE SINGLE (24 HR)
4 series · 4 of 4 positions shown · non-contrast
Comparison: None

CLINICAL DATA: Hyperthyroidism

EXAM:
THYROID SCAN AND UPTAKE - 24 HOURS
TECHNIQUE: Following the per oral administration of X-5M5 sodium iodide, the
patient returned at 24 hours and uptake measurements were acquired
with the uptake probe centered on the neck. Thyroid imaging was
performed following the intravenous administration of the Xc-RRm
Pertechnetate.
RADIOPHARMACEUTICALS:  14.8 microCuries X-5M5 sodium iodide orally
and 10.6 mCi Jechnetium-JJm pertechnetate IV

[Series 1: anterior · 3.25mm/px · 1 of 1 slices shown]
[im 1/1]
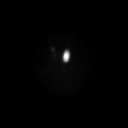

[Series 2: ant w marker · 3.25mm/px · 1 of 1 slices shown]
[im 1/1]
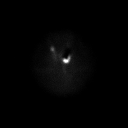

[Series 3: lao · 3.25mm/px · 1 of 1 slices shown]
[im 1/1]
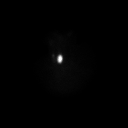

[Series 4: rao · 3.25mm/px · 1 of 1 slices shown]
[im 1/1]
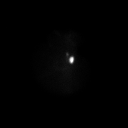

[4 of 4 positions shown; findings below may reference images not displayed]

FINDINGS: 24 hour radio iodine uptake calculated at 39%, above the normal
range consistent with hyperthyroidism.

Images of the thyroid gland in 3 projections demonstrate a prominent
area of abnormal increased tracer localization in the LEFT lobe
consistent with a hyperfunctional LEFT lobe adenoma.

Suppression of uptake of tracer within majority of the RIGHT lobe,
with only a small focus of increased tracer localization seen at the
upper pole likely an additional hyper functional adenoma.
IMPRESSION: Elevated 24 hour radio iodine uptake of 39%.

Large hot nodule LEFT lobe and tiny warm nodule RIGHT lobe.

Findings are consistent with a toxic multinodular thyroid gland.

## 2023-03-04 ENCOUNTER — Emergency Department (HOSPITAL_COMMUNITY)
Admission: EM | Admit: 2023-03-04 | Discharge: 2023-03-04 | Disposition: A | Discharge status: Home / Self Care | Payer: BLUE CROSS/BLUE SHIELD | Attending: Emergency Medicine | Admitting: Emergency Medicine

## 2023-03-04 ENCOUNTER — Emergency Department (HOSPITAL_COMMUNITY): Payer: BLUE CROSS/BLUE SHIELD

## 2023-03-04 ENCOUNTER — Other Ambulatory Visit: Payer: Self-pay

## 2023-03-04 DIAGNOSIS — R638 Other symptoms and signs concerning food and fluid intake: Secondary | ICD-10-CM | POA: Diagnosis not present

## 2023-03-04 DIAGNOSIS — R569 Unspecified convulsions: Secondary | ICD-10-CM | POA: Diagnosis not present

## 2023-03-04 DIAGNOSIS — I10 Essential (primary) hypertension: Secondary | ICD-10-CM | POA: Insufficient documentation

## 2023-03-04 DIAGNOSIS — R251 Tremor, unspecified: Secondary | ICD-10-CM | POA: Insufficient documentation

## 2023-03-04 DIAGNOSIS — R55 Syncope and collapse: Secondary | ICD-10-CM | POA: Insufficient documentation

## 2023-03-04 LAB — COMPREHENSIVE METABOLIC PANEL WITH GFR
ALT: 26 U/L (ref 0–44)
AST: 25 U/L (ref 15–41)
Albumin: 4.4 g/dL (ref 3.5–5.0)
Alkaline Phosphatase: 44 U/L (ref 38–126)
Anion gap: 12 (ref 5–15)
BUN: 16 mg/dL (ref 6–20)
CO2: 23 mmol/L (ref 22–32)
Calcium: 8.1 mg/dL — ABNORMAL LOW (ref 8.9–10.3)
Chloride: 104 mmol/L (ref 98–111)
Creatinine, Ser: 1.11 mg/dL — ABNORMAL HIGH (ref 0.44–1.00)
GFR, Estimated: 58 mL/min — ABNORMAL LOW
Glucose, Bld: 116 mg/dL — ABNORMAL HIGH (ref 70–99)
Potassium: 4 mmol/L (ref 3.5–5.1)
Sodium: 139 mmol/L (ref 135–145)
Total Bilirubin: 0.5 mg/dL (ref 0.3–1.2)
Total Protein: 7.3 g/dL (ref 6.5–8.1)

## 2023-03-04 LAB — CBC WITH DIFFERENTIAL/PLATELET
Abs Immature Granulocytes: 0.02 K/uL (ref 0.00–0.07)
Basophils Absolute: 0.1 K/uL (ref 0.0–0.1)
Basophils Relative: 1 %
Eosinophils Absolute: 0.1 K/uL (ref 0.0–0.5)
Eosinophils Relative: 2 %
HCT: 42.1 % (ref 36.0–46.0)
Hemoglobin: 13.7 g/dL (ref 12.0–15.0)
Immature Granulocytes: 0 %
Lymphocytes Relative: 14 %
Lymphs Abs: 1.2 K/uL (ref 0.7–4.0)
MCH: 30.4 pg (ref 26.0–34.0)
MCHC: 32.5 g/dL (ref 30.0–36.0)
MCV: 93.3 fL (ref 80.0–100.0)
Monocytes Absolute: 0.4 K/uL (ref 0.1–1.0)
Monocytes Relative: 4 %
Neutro Abs: 6.8 K/uL (ref 1.7–7.7)
Neutrophils Relative %: 79 %
Platelets: 167 K/uL (ref 150–400)
RBC: 4.51 MIL/uL (ref 3.87–5.11)
RDW: 12.9 % (ref 11.5–15.5)
WBC: 8.6 K/uL (ref 4.0–10.5)
nRBC: 0 % (ref 0.0–0.2)

## 2023-03-04 LAB — URINALYSIS, ROUTINE W REFLEX MICROSCOPIC
Bilirubin Urine: NEGATIVE
Glucose, UA: NEGATIVE mg/dL
Hgb urine dipstick: NEGATIVE
Ketones, ur: NEGATIVE mg/dL
Nitrite: NEGATIVE
Protein, ur: NEGATIVE mg/dL
Specific Gravity, Urine: 1.023 (ref 1.005–1.030)
pH: 6 (ref 5.0–8.0)

## 2023-03-04 LAB — TROPONIN I (HIGH SENSITIVITY)
Troponin I (High Sensitivity): <2 ng/L (ref <18)
Troponin I (High Sensitivity): 4 ng/L

## 2023-03-04 MED ORDER — LACTATED RINGERS IV BOLUS
1000.0000 mL | Freq: Once | INTRAVENOUS | Status: AC
  Administered 2023-03-04: 1000 mL via INTRAVENOUS

## 2023-03-04 NOTE — ED Provider Notes (Signed)
[facility] Provider Note   CSN: [id number] Arrival date & time: 03/04/23  1100     History Chief Complaint  Patient presents with   Loss of Consciousness   Seizures    HPI Kelsey Jefferson is a 57 y.o. female presenting for syncope episode.  She is a 57 year old female with a minimal medical history.  She states that she was getting her nails done when she became hot and flustered and passed out.  A bystander noticed shaking of her bilateral upper extremities that occurred during the event.  She denied any postictal period but states she felt weak.  Endorses that she has been going through hot flashes and had a prodrome of severe hot flash before the syncopal episode.  Endorses decreased p.o. intake today as she was very busy endorses a strenuous day otherwise.  Patient's recorded medical, surgical, social, medication list and allergies were reviewed in the Snapshot window as part of the initial history.   Review of Systems   Review of Systems  Constitutional:  Negative for chills and fever.  HENT:  Negative for ear pain and sore throat.   Eyes:  Negative for pain and visual disturbance.  Respiratory:  Negative for cough and shortness of breath.   Cardiovascular:  Negative for chest pain and palpitations.  Gastrointestinal:  Negative for abdominal pain and vomiting.  Genitourinary:  Negative for dysuria and hematuria.  Musculoskeletal:  Negative for arthralgias and back pain.  Skin:  Negative for color change and rash.  Neurological:  Positive for seizures and syncope.  All other systems reviewed and are negative.   Physical Exam Updated Vital Signs BP 122/79 (BP Location: Right Arm)   Pulse 73   Temp (!) 97.5 F (36.4 C) (Oral)   Resp 18   Ht 5' 6.75" (1.695 m)   SpO2 100%   BMI 27.90 kg/m  Physical Exam Vitals and nursing note reviewed.  Constitutional:      General: She is not in acute distress.    Appearance: She is  well-developed.  HENT:     Head: Normocephalic and atraumatic.  Eyes:     Conjunctiva/sclera: Conjunctivae normal.  Cardiovascular:     Rate and Rhythm: Normal rate and regular rhythm.     Heart sounds: No murmur heard. Pulmonary:     Effort: Pulmonary effort is normal. No respiratory distress.     Breath sounds: Normal breath sounds.  Abdominal:     General: There is no distension.     Palpations: Abdomen is soft.     Tenderness: There is no abdominal tenderness. There is no right CVA tenderness or left CVA tenderness.  Musculoskeletal:        General: No swelling or tenderness. Normal range of motion.     Cervical back: Neck supple.  Skin:    General: Skin is warm and dry.  Neurological:     General: No focal deficit present.     Mental Status: She is alert and oriented to person, place, and time. Mental status is at baseline.     Cranial Nerves: No cranial nerve deficit.      ED Course/ Medical Decision Making/ A&P    Procedures Procedures   Medications Ordered in ED Medications - No data to display Medical Decision Making:   Kelsey Jefferson is a 57 y.o. female who presented to the ED today with a syncopal episode detailed above.    Additional history discussed with patient's  family/caregivers.  Patient's presentation is complicated by their history of Hypertension, thyroid dysfunction.  Patient placed on continuous vitals and telemetry monitoring while in ED which was reviewed periodically.  Complete initial physical exam performed, notably the patient  was hemodynamically stable in no acute distress.  Currently asymptomatic.    Reviewed and confirmed nursing documentation for past medical history, family history, social history.    Initial Assessment:   With the patient's presentation of syncope, most likely diagnosis is orthostatic hypotension vs vasovagal episode. Other diagnoses were considered including (but not limited to) arrythmogenic syncope, valvular  abnormality, PE, aortic dissection. These are considered less likely due to history of present illness and physical exam findings.   This is most consistent with an acute life/limb threatening illness complicated by underlying chronic conditions. In particular, concerning cardiac etiology, this is less likely to be the etiology given the lack of chest pain, lack of serious comorbidities including heart failure orCAD.   Additionally, patient's history appears more consistent with benign episodes including orthostatic remains vagal episode based on description of hot flash prior to the syncopal episode, feelings of palpitations during the episode..  Initial Plan:  Screening labs including CBC and Metabolic panel to evaluate for infectious or metabolic etiology of disease.  Urinalysis with reflex culture ordered to evaluate for UTI or relevant urologic/nephrologic pathology.  CXR to evaluate for structural/infectious intrathoracic pathology.  Troponing/EKG to evaluate for cardiac pathology. Utilization of FAINT scoring detailed above.  CT head with for structural intracranial abnormality Objective evaluation as below reviewed after administration of IVF/Telemetry monitoring  Initial Study Results:   Laboratory  All laboratory results reviewed without evidence of clinically relevant pathology.    EKG EKG was reviewed independently. Rate, rhythm, axis, intervals all examined and without medically relevant abnormality. ST segments without concerns for elevations.    Radiology:  All images reviewed independently. Agree with radiology report at this time.   CT Head Wo Contrast  Result Date: 03/04/2023 CLINICAL DATA:  Syncope/presyncope. EXAM: CT HEAD WITHOUT CONTRAST TECHNIQUE: Contiguous axial images were obtained from the base of the skull through the vertex without intravenous contrast. RADIATION DOSE REDUCTION: This exam was performed according to the departmental dose-optimization program which  includes automated exposure control, adjustment of the mA and/or kV according to patient size and/or use of iterative reconstruction technique. COMPARISON:  Brain MRI 09/13/2015 FINDINGS: Brain: There is no evidence for acute hemorrhage, hydrocephalus, mass lesion, or abnormal extra-axial fluid collection. No definite CT evidence for acute infarction. Vascular: No hyperdense vessel or unexpected calcification. Skull: No evidence for fracture. No worrisome lytic or sclerotic lesion. Sinuses/Orbits: The visualized paranasal sinuses and mastoid air cells are clear. Visualized portions of the globes and intraorbital fat are unremarkable. Other: None. IMPRESSION: No acute intracranial abnormality. Electronically Signed   By: Misty Stanley M.D.   On: 03/04/2023 13:06   DG Chest 2 View  Result Date: 03/04/2023 CLINICAL DATA:  Syncope EXAM: CHEST - 2 VIEW COMPARISON:  CXR 06/11/18 FINDINGS: The heart size and mediastinal contours are within normal limits. Both lungs are clear. The visualized skeletal structures are unremarkable. IMPRESSION: No active cardiopulmonary disease. Electronically Signed   By: Marin Roberts M.D.   On: 03/04/2023 11:54    Final Assessment and Plan:   Patient's objective findings are reassuring, she was given IV fluids and able to ambulate in the emergency department without difficulty.  Ultimately observed for 6 hours without further symptoms.  Had a shared medical conversation with the patient.  Could consider further inpatient workup of seizure-like disorder, however given nonspecific presentation and likely consistency with vasovagal syncope, patient felt comfort with outpatient follow-up with neurology.  We discussed seizure precautions including avoidance of water and driving until evaluated by neurology and patient expressed understanding.  This included a discussion recommending against unobserved baths.  Disposition:  I have considered need for hospitalization, however, considering  all of the above, I believe this patient is stable for discharge at this time.  Patient/family educated about specific return precautions for given chief complaint and symptoms.  Patient/family educated about follow-up with PCP/Neurology.     Patient/family expressed understanding of return precautions and need for follow-up. Patient spoken to regarding all imaging and laboratory results and appropriate follow up for these results. All education provided in verbal form with additional information in written form. Time was allowed for answering of patient questions. Patient discharged.    Emergency Department Medication Summary:   Medications  lactated ringers bolus 1,000 mL (1,000 mLs Intravenous New Bag/Given 03/04/23 1613)          Clinical Impression: No diagnosis found.   Data Unavailable   Clinical Impression: No diagnosis found.   Data Unavailable   Final Clinical Impression(s) / ED Diagnoses Final diagnoses:  None    Rx / DC Orders ED Discharge Orders     None         Tretha Sciara, MD 03/04/23 1714

## 2023-03-04 NOTE — ED Triage Notes (Signed)
God mother/pt stated, I was getting my haircut and all of sudden I got hot and passed out and I locked up like I had a seizure. I was unable to talk.. Happened one hour ago. Never happened before. Does not know how long it was that I was out. I do have a hx of vertigo.

## 2023-03-04 NOTE — ED Provider Triage Note (Signed)
Emergency Medicine Provider Triage Evaluation Note  Kelsey Jefferson , a 57 y.o. female  was evaluated in triage.  Pt complains of syncopal episode today.  Patient reports that she was sitting getting her haircut when she felt hot and sweaty and then had a syncopal episode in the chair.  She reports that her barber said that she kind of clenched and trampled/took and her eyes were open.  She is concerned that she had a seizure she has never had before.  She reports that she feels fine now.  Did not have any chest pain or shortness of breath before or after the syncopal episode.  She reports that she did have 1 episode of diarrhea recently but no other symptoms.  Review of Systems  Positive:  Negative:   Physical Exam  BP 122/79 (BP Location: Right Arm)   Pulse 73   Temp (!) 97.5 F (36.4 C) (Oral)   Resp 18   Ht 5' 6.75" (1.695 m)   SpO2 100%   BMI 27.90 kg/m  Gen:   Awake, no distress   Resp:  Normal effort  MSK:   Moves extremities without difficulty  Other:  Cranial nerves II through XII grossly intact.  Strength grossly intact in all extremities.  Answers questions appropriately with appropriate speech.  Medical Decision Making  Medically screening exam initiated at 11:33 AM.  Appropriate orders placed.  Kelsey Jefferson was informed that the remainder of the evaluation will be completed by another provider, this initial triage assessment does not replace that evaluation, and the importance of remaining in the ED until their evaluation is complete.  CT head and labs ordered   Sherrell Puller, Hershal Coria 03/04/23 1134

## 2023-04-29 ENCOUNTER — Ambulatory Visit: Payer: BLUE CROSS/BLUE SHIELD | Admitting: Neurology
